# Patient Record
Sex: Male | Born: 1985 | Race: White | Hispanic: Yes | Marital: Single | Smoking: Never smoker
Health system: Southern US, Community
[De-identification: ages and names within clinical notes are randomized; demographics above are authoritative.]

## PROBLEM LIST (undated history)

## (undated) DIAGNOSIS — R011 Cardiac murmur, unspecified: Secondary | ICD-10-CM

---

## 2007-08-03 ENCOUNTER — Emergency Department (HOSPITAL_COMMUNITY): Admission: EM | Admit: 2007-08-03 | Discharge: 2007-08-03 | Payer: Self-pay | Admitting: Emergency Medicine

## 2008-06-13 ENCOUNTER — Emergency Department (HOSPITAL_COMMUNITY): Admission: EM | Admit: 2008-06-13 | Discharge: 2008-06-13 | Payer: Self-pay | Admitting: Emergency Medicine

## 2009-04-27 IMAGING — CR DG FINGER INDEX 2+V*R*
3 series · 3 of 3 positions shown · non-contrast
Comparison: 08/03/2007

CLINICAL DATA: Laceration to finger

RIGHT INDEX FINGER 2+V

[x finger pa right]
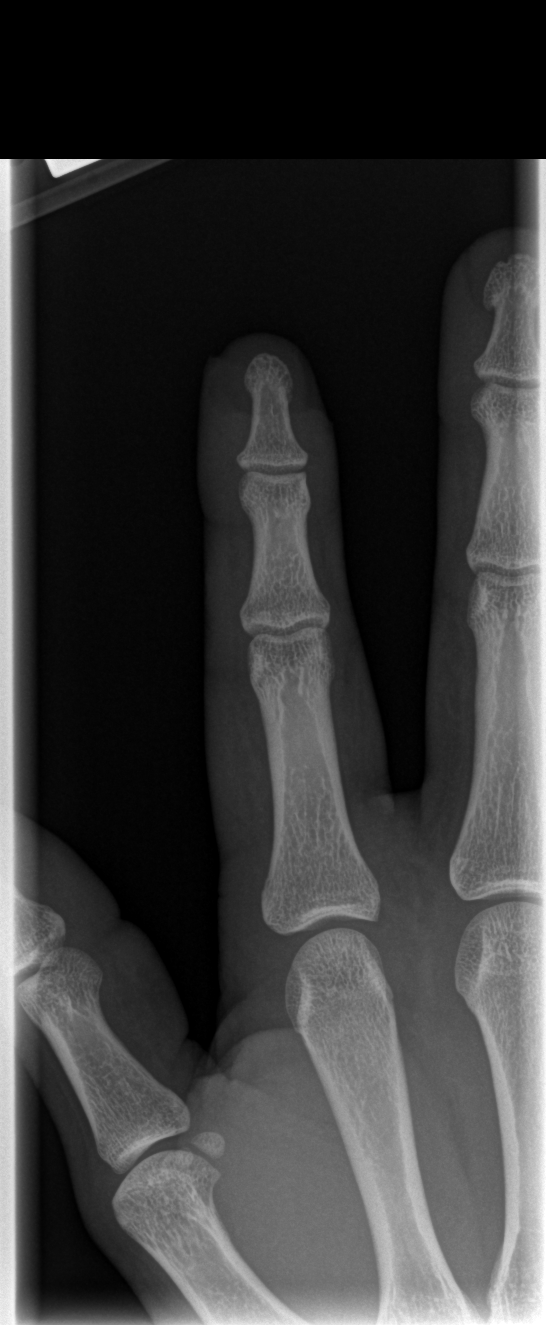

[x finger obl. right]
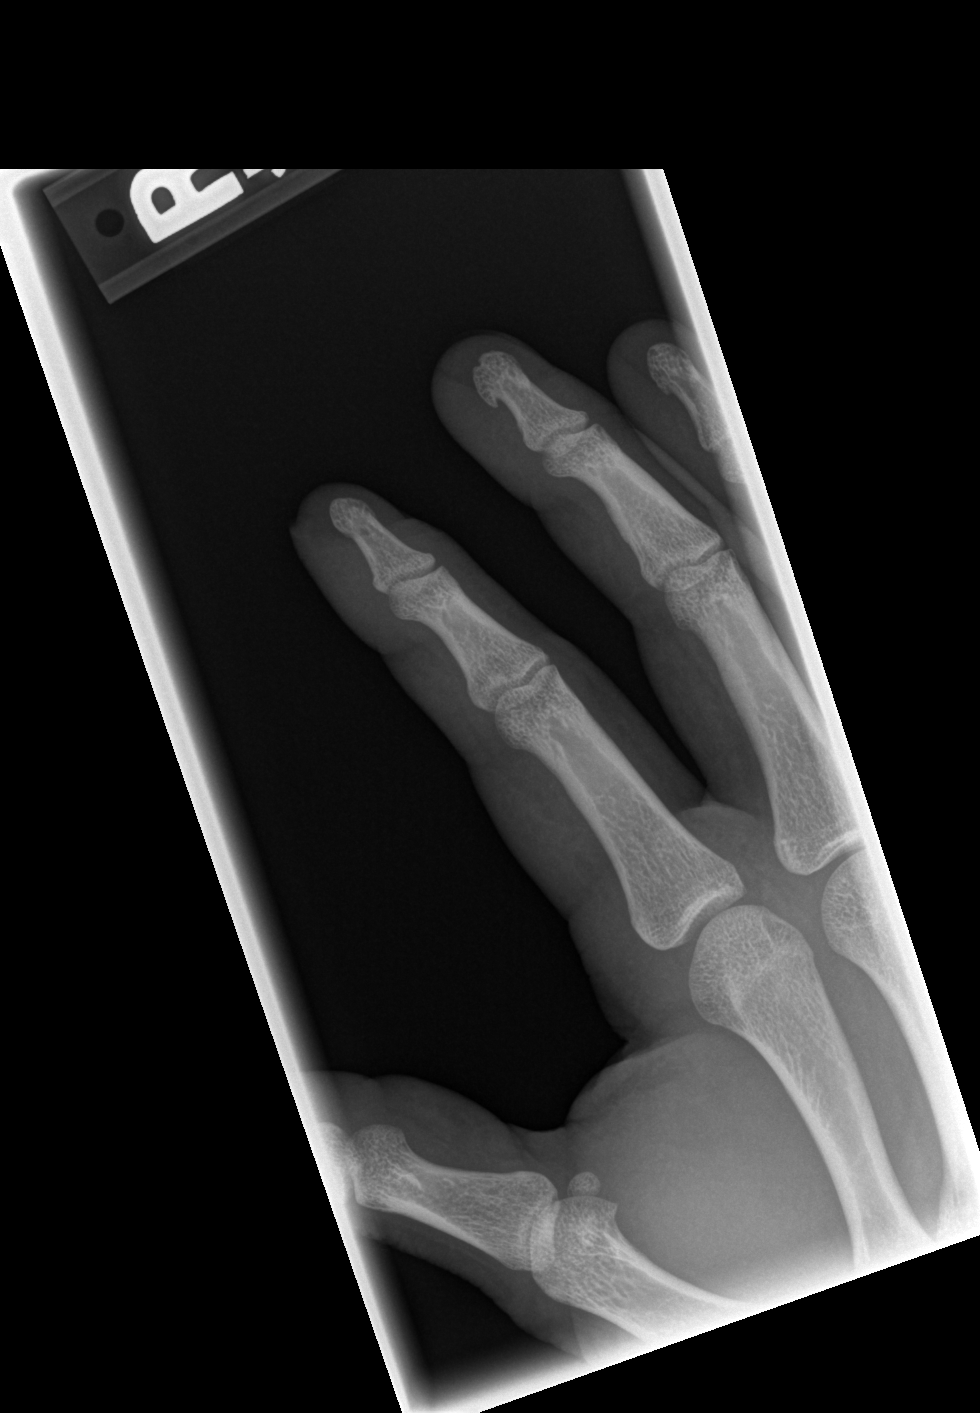

[x finger lateral right]
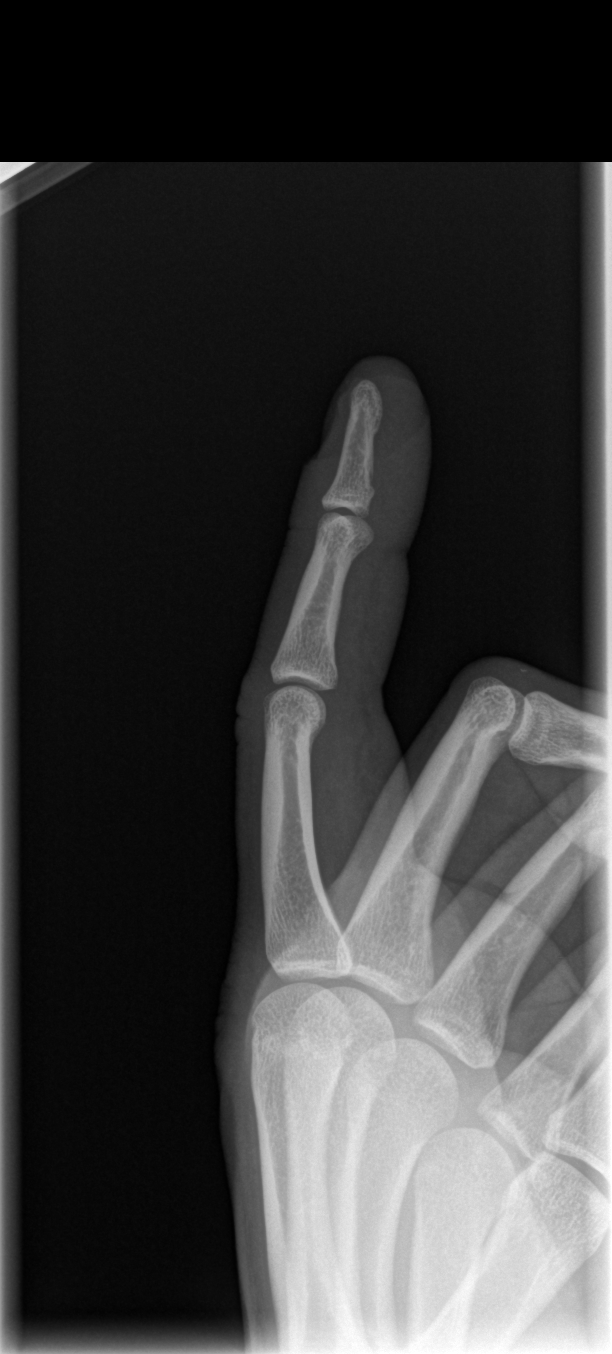

[3 of 3 positions shown; findings below may reference images not displayed]

FINDINGS: Soft tissue laceration.  No fracture or bony
displacement.
IMPRESSION: No fracture.

## 2020-12-17 ENCOUNTER — Emergency Department
Admission: EM | Admit: 2020-12-17 | Discharge: 2020-12-18 | Disposition: A | Discharge status: Home / Self Care | Payer: BC Managed Care – PPO | Attending: Emergency Medicine | Admitting: Emergency Medicine

## 2020-12-17 ENCOUNTER — Other Ambulatory Visit: Payer: Self-pay

## 2020-12-17 DIAGNOSIS — R079 Chest pain, unspecified: Secondary | ICD-10-CM | POA: Diagnosis not present

## 2020-12-17 DIAGNOSIS — R0789 Other chest pain: Secondary | ICD-10-CM | POA: Diagnosis present

## 2020-12-17 HISTORY — DX: Cardiac murmur, unspecified: R01.1

## 2020-12-17 LAB — CBC
HCT: 42.8 % (ref 39.0–52.0)
Hemoglobin: 15.1 g/dL (ref 13.0–17.0)
MCH: 30.6 pg (ref 26.0–34.0)
MCHC: 35.3 g/dL (ref 30.0–36.0)
MCV: 86.8 fL (ref 80.0–100.0)
Platelets: 303 10*3/uL (ref 150–400)
RBC: 4.93 MIL/uL (ref 4.22–5.81)
RDW: 12.5 % (ref 11.5–15.5)
WBC: 9.1 10*3/uL (ref 4.0–10.5)
nRBC: 0 % (ref 0.0–0.2)

## 2020-12-17 LAB — BASIC METABOLIC PANEL
Anion gap: 9 (ref 5–15)
BUN: 13 mg/dL (ref 6–20)
CO2: 29 mmol/L (ref 22–32)
Calcium: 9.5 mg/dL (ref 8.9–10.3)
Chloride: 100 mmol/L (ref 98–111)
Creatinine, Ser: 1.11 mg/dL (ref 0.61–1.24)
GFR, Estimated: >60 mL/min (ref >60)
Glucose, Bld: 97 mg/dL (ref 70–99)
Potassium: 3.7 mmol/L (ref 3.5–5.1)
Sodium: 138 mmol/L (ref 135–145)

## 2020-12-17 LAB — TROPONIN I (HIGH SENSITIVITY): Troponin I (High Sensitivity): 3 ng/L (ref <18)

## 2020-12-17 NOTE — ED Triage Notes (Signed)
PT to ED via pov from home. PT has central, sharp CP since Wednesday that is gradually getting worse. Worse with lying down. No sob.

## 2020-12-18 NOTE — Discharge Instructions (Signed)
You have been seen in the Emergency Department (ED) today for chest pain.  As we have discussed today's test results are normal, but you may require further testing.  Please follow up with the recommended doctor as instructed above in these documents regarding today's emergent visit and your recent symptoms to discuss further management.  I recommend you try taking ibuprofen 600 mg 3 times a day with meals for at least the next week to see if this helps with the pain.  Alternatively you could take a full dose (325 mg) aspirin once a day which may also be helpful.  Return to the Emergency Department (ED) if you experience any further chest pain/pressure/tightness, difficulty breathing, or sudden sweating, or other symptoms that concern you.

## 2020-12-18 NOTE — ED Provider Notes (Signed)
[facility] Emergency Department Provider Note  ____________________________________________   Event Date/Time   First MD Initiated Contact with Patient 12/17/20 2358     (approximate)  I have reviewed the triage vital signs and the nursing notes.   HISTORY  Chief Complaint Chest Pain    HPI Seth Choi is a 33 y.o. male with no contributory chronic medical conditions.  He presents for evaluation of about 5 to 6 days of slightly left-sided anterior chest pain.  He said that it was acute in onset and there was no particular trauma although he does lift heavy objects.  It seems to be worse if he lies down flat and better when he is up moving around.  It is not necessarily reproducible with touching his chest although he is not sure.  He has had no shortness of breath.  He denies fever/chills, sore throat, cough, nausea, vomiting, abdominal pain.  He has no history of blood clots in the legs of the lungs, no recent immobilizations or surgeries, no history of heart disease.        Past Medical History:  Diagnosis Date  . Heart murmur     There are no problems to display for this patient.   History reviewed. No pertinent surgical history.  Prior to Admission medications   Not on File    Allergies Patient has no known allergies.  No family history on file.  Social History Social History   Tobacco Use  . Smoking status: Never Smoker  . Smokeless tobacco: Never Used  Vaping Use  . Vaping Use: Never used  Substance Use Topics  . Alcohol use: Never  . Drug use: Never    Review of Systems Constitutional: No fever/chills Eyes: No visual changes. ENT: No sore throat. Cardiovascular: Chest pain as described above. Respiratory: Denies shortness of breath. Gastrointestinal: No abdominal pain.  No nausea, no vomiting.  No diarrhea.  No constipation. Genitourinary: Negative for dysuria. Musculoskeletal: Negative for neck pain.  Negative for  back pain. Integumentary: Negative for rash. Neurological: Negative for headaches, focal weakness or numbness.   ____________________________________________   PHYSICAL EXAM:  VITAL SIGNS: ED Triage Vitals  Enc Vitals Group     BP 12/17/20 2223 129/62     Pulse Rate 12/17/20 2223 68     Resp 12/17/20 2223 18     Temp 12/17/20 2223 98.2 F (36.8 C)     Temp Source 12/17/20 2223 Oral     SpO2 12/17/20 2223 100 %     Weight 12/17/20 2224 72.6 kg (160 lb)     Height 12/17/20 2224 1.727 m (5\' 8" )     Head Circumference --      Peak Flow --      Pain Score 12/17/20 2223 6     Pain Loc --      Pain Edu? --      Excl. in GC? --     Constitutional: Alert and oriented.  Eyes: Conjunctivae are normal.  Head: Atraumatic. Nose: No congestion/rhinnorhea. Mouth/Throat: Patient is wearing a mask. Neck: No stridor.  No meningeal signs.   Cardiovascular: Normal rate, regular rhythm. Good peripheral circulation.  Chest pain is not reproducible with palpation of the anterior chest. Respiratory: Normal respiratory effort.  No retractions. Gastrointestinal: Soft and nontender. No distention.  Musculoskeletal: No lower extremity tenderness nor edema. No gross deformities of extremities. Neurologic:  Normal speech and language. No gross focal neurologic deficits are appreciated.  Skin:  Skin is warm,  dry and intact. Psychiatric: Mood and affect are normal. Speech and behavior are normal.  ____________________________________________   LABS (all labs ordered are listed, but only abnormal results are displayed)  Labs Reviewed  BASIC METABOLIC PANEL  CBC  TROPONIN I (HIGH SENSITIVITY)  TROPONIN I (HIGH SENSITIVITY)   ____________________________________________  EKG  ED ECG REPORT I, Loleta Rose, the attending physician, personally viewed and interpreted this ECG.  Date: 12/17/2020 EKG Time: 22: 26 Rate: 81 Rhythm: normal sinus rhythm with sinus arrhythmia QRS Axis:  normal Intervals: normal ST/T Wave abnormalities: Inverted T waves in lead III, otherwise unremarkable Narrative Interpretation: no evidence of acute ischemia  ____________________________________________  RADIOLOGY I, Loleta Rose, personally viewed and evaluated these images (plain radiographs) as part of my medical decision making, as well as reviewing the written report by the radiologist.  ED MD interpretation: No indication for emergent chest x-ray  Official radiology report(s): No results found.  ____________________________________________   PROCEDURES   Procedure(s) performed (including Critical Care):  Procedures   ____________________________________________   INITIAL IMPRESSION / MDM / ASSESSMENT AND PLAN / ED COURSE  As part of my medical decision making, I reviewed the following data within the electronic MEDICAL RECORD NUMBER Nursing notes reviewed and incorporated, Labs reviewed , EKG interpreted , Old chart reviewed and Notes from prior ED visits   Differential diagnosis includes, but is not limited to, musculoskeletal pain, costochondritis, pericarditis, myocarditis, ACS, PE, aortic dissection.  The patient's vital signs are stable and reassuring.  He has had pain for 5 to 6 days which is also reassuring in light of his normal troponin.  No evidence of any concerning abnormalities on his EKG including ischemia nor pericarditis.  His CBC and metabolic panel are also normal.  Given his low risk for ACS based on HEAR score, PERC negative status, and reassuring evaluation, I think he would be appropriate for discharge and outpatient follow-up.  I recommended the use of NSAIDs and contacting cardiology for follow-up appointment.  I gave my usual and customary chest pain return precautions and he understands and agrees with the plan.           ____________________________________________  FINAL CLINICAL IMPRESSION(S) / ED DIAGNOSES  Final diagnoses:  Chest  pain, unspecified type     MEDICATIONS GIVEN DURING THIS VISIT:  Medications - No data to display   ED Discharge Orders    None      *Please note:  Seth Choi was evaluated in Emergency Department on 12/18/2020 for the symptoms described in the history of present illness. He was evaluated in the context of the global COVID-19 pandemic, which necessitated consideration that the patient might be at risk for infection with the SARS-CoV-2 virus that causes COVID-19. Institutional protocols and algorithms that pertain to the evaluation of patients at risk for COVID-19 are in a state of rapid change based on information released by regulatory bodies including the CDC and federal and state organizations. These policies and algorithms were followed during the patient's care in the ED.  Some ED evaluations and interventions may be delayed as a result of limited staffing during and after the pandemic.*  Note:  This document was prepared using Dragon voice recognition software and may include unintentional dictation errors.   Loleta Rose, MD 12/18/20 [id number]
